# Patient Record
Sex: Male | Born: 1960 | Race: Black or African American | Hispanic: No | Marital: Single | Smoking: Current every day smoker
Health system: Southern US, Community
[De-identification: ages and names within clinical notes are randomized; demographics above are authoritative.]

## PROBLEM LIST (undated history)

## (undated) DIAGNOSIS — I10 Essential (primary) hypertension: Secondary | ICD-10-CM

## (undated) DIAGNOSIS — E119 Type 2 diabetes mellitus without complications: Secondary | ICD-10-CM

## (undated) DIAGNOSIS — M109 Gout, unspecified: Secondary | ICD-10-CM

---

## 2017-12-20 ENCOUNTER — Emergency Department
Admission: EM | Admit: 2017-12-20 | Discharge: 2017-12-20 | Disposition: A | Discharge status: Home / Self Care | Payer: BLUE CROSS/BLUE SHIELD | Attending: Emergency Medicine | Admitting: Emergency Medicine

## 2017-12-20 ENCOUNTER — Other Ambulatory Visit: Payer: Self-pay

## 2017-12-20 ENCOUNTER — Encounter: Payer: Self-pay | Admitting: Emergency Medicine

## 2017-12-20 DIAGNOSIS — M25561 Pain in right knee: Secondary | ICD-10-CM

## 2017-12-20 DIAGNOSIS — I10 Essential (primary) hypertension: Secondary | ICD-10-CM

## 2017-12-20 DIAGNOSIS — M25551 Pain in right hip: Secondary | ICD-10-CM

## 2017-12-20 DIAGNOSIS — F17210 Nicotine dependence, cigarettes, uncomplicated: Secondary | ICD-10-CM | POA: Diagnosis not present

## 2017-12-20 DIAGNOSIS — M545 Low back pain: Secondary | ICD-10-CM | POA: Diagnosis present

## 2017-12-20 DIAGNOSIS — M79672 Pain in left foot: Secondary | ICD-10-CM | POA: Diagnosis not present

## 2017-12-20 HISTORY — DX: Gout, unspecified: M10.9

## 2017-12-20 MED ORDER — LISINOPRIL 5 MG PO TABS: 5.0000 mg | ORAL_TABLET | Freq: Every day | ORAL | 11 refills | Status: AC

## 2017-12-20 MED ORDER — MELOXICAM 15 MG PO TABS: 15.0000 mg | ORAL_TABLET | Freq: Every day | ORAL | 0 refills | Status: DC

## 2017-12-20 MED ORDER — TRAMADOL HCL 50 MG PO TABS: 50.0000 mg | ORAL_TABLET | Freq: Four times a day (QID) | ORAL | 0 refills | Status: AC | PRN

## 2017-12-20 NOTE — ED Provider Notes (Signed)
[facility] Emergency Department Provider Note ____________________________________________  Time seen: Approximately 11:10 AM  I have reviewed the triage vital signs and the nursing notes.   HISTORY  Chief Complaint Knee Pain; Foot Pain; and Back Pain    HPI Zamauri Nez is a 57 y.o. male who presents to the emergency department for evaluation and treatment of right lower back pain and right knee pain.  Unsure if he twisted his knee. He also complains of pain to the left foot with a "hard knot" under the skin. Additionally, he has been out of his blood pressure medication and would like enough to last until he can schedule an appointment with primary care.  Past Medical History:  Diagnosis Date  . Gout     There are no active problems to display for this patient.   History reviewed. No pertinent surgical history.  Prior to Admission medications   Medication Sig Start Date End Date Taking? Authorizing Provider  lisinopril (PRINIVIL,ZESTRIL) 5 MG tablet Take 1 tablet (5 mg total) by mouth daily. 12/20/17 12/20/18  Broughton Eppinger, Rulon Eisenmenger B, FNP  meloxicam (MOBIC) 15 MG tablet Take 1 tablet (15 mg total) by mouth daily. 12/20/17   Lashan Macias, Rulon Eisenmenger B, FNP  traMADol (ULTRAM) 50 MG tablet Take 1 tablet (50 mg total) by mouth every 6 (six) hours as needed. 12/20/17   Chinita Pester, FNP    Allergies Patient has no allergy information on record.  No family history on file.  Social History Social History   Tobacco Use  . Smoking status: Current Every Day Smoker    Packs/day: 0.50    Types: Cigarettes  . Smokeless tobacco: Never Used  Substance Use Topics  . Alcohol use: Yes  . Drug use: Not Currently    Review of Systems Constitutional: Negative for fever. Cardiovascular: Negative for chest pain. Respiratory: Negative for shortness of breath. Musculoskeletal: Positive for right knee and hip pain and left foot pain. Skin: Negative for open wound or  lesion.  Neurological: Negative for decrease in sensation  ____________________________________________   PHYSICAL EXAM:  VITAL SIGNS: ED Triage Vitals  Enc Vitals Group     BP 12/20/17 0953 (!) 184/79     Pulse Rate 12/20/17 0953 83     Resp 12/20/17 0953 16     Temp 12/20/17 0953 98.2 F (36.8 C)     Temp Source 12/20/17 0953 Oral     SpO2 12/20/17 0953 97 %     Weight 12/20/17 0955 221 lb (100.2 kg)     Height 12/20/17 0955 6' 1.5" (1.867 m)     Head Circumference --      Peak Flow --      Pain Score 12/20/17 0955 7     Pain Loc --      Pain Edu? --      Excl. in GC? --     Constitutional: Alert and oriented. Well appearing and in no acute distress. Eyes: Conjunctivae are clear without discharge or drainage Head: Atraumatic Neck: Supple. Respiratory: No cough. Respirations are even and unlabored. Musculoskeletal: Focal tenderness in the popliteal area of the right knee. No focal tenderness of the right hip. Hard calcification in the soft tissue on the arch of the left foot. Full ROM of the toes. Arch is intact. Neurologic: Awake, alert, and oriented x 4.  Skin: Intact.  Psychiatric: Affect and behavior are appropriate.  ____________________________________________   LABS (all labs ordered are listed, but only abnormal results are displayed)  Labs Reviewed - No data to display ____________________________________________  RADIOLOGY  Not indicated. ____________________________________________   PROCEDURES  Procedures  ____________________________________________   INITIAL IMPRESSION / ASSESSMENT AND PLAN / ED COURSE  Dillion Stowers is a 56 y.o. who presents to the emergency department for treatment and evaluation of RLE, left foot pain, and medication refill. Knee has hurt for "a long time" and swells occasionally. Swelling is resolved with rest and elevation. Hip pain is likely secondary to altered gait due to knee pain. Left foot nodule is mobile. He  is to follow up with orthopedics and podiatry. He is also to schedule an appointment with [facility] for primary care to manage his hypertension. Today, he will be given Tramadol, Meloxicam, and Lisinopril. He is to return to the ER for symptoms that change or worsen or for new concerns.  Medications - No data to display  Pertinent labs & imaging results that were available during my care of the patient were reviewed by me and considered in my medical decision making (see chart for details).  _________________________________________   FINAL CLINICAL IMPRESSION(S) / ED DIAGNOSES  Final diagnoses:  Acute pain of right knee  Acute pain of right hip  Foot pain, left  Hypertension, unspecified type    ED Discharge Orders        Ordered    meloxicam (MOBIC) 15 MG tablet  Daily     12/20/17 1139    traMADol (ULTRAM) 50 MG tablet  Every 6 hours PRN     12/20/17 1139    lisinopril (PRINIVIL,ZESTRIL) 5 MG tablet  Daily     12/20/17 1139       If controlled substance prescribed during this visit, 12 month history viewed on the [place] prior to issuing an initial prescription for Schedule II or III opiod.    Chinita Pester, FNP 12/20/17 1146    Jene Every, MD 12/20/17 1221

## 2017-12-20 NOTE — ED Notes (Signed)
See triage note presents with pain to right lower back and knee   Unsure of injury but may have twisted  His knee   Min swelling noted  Also having some pain to left foot  Ambulates well to treatment room

## 2017-12-20 NOTE — ED Notes (Signed)
Patient states he has HA's frequently.  Is supposed to take Lisinopril but has not taken in 2 years.

## 2017-12-20 NOTE — ED Notes (Signed)
First Nurse Note:  Patient ambulatory to registration, states "I'm falling apart".  Complaining of pain in left foot and right knee.

## 2017-12-20 NOTE — ED Triage Notes (Signed)
Patient complaining of pain right knee, denies injury.  Pain for "a few weeks, worse in early AM".  States there is "a ball" on the bottom of his left foot for a "little while, getting better".  Back pain for "a few months", indicates lower back radiating to right hip.

## 2017-12-24 DIAGNOSIS — M1711 Unilateral primary osteoarthritis, right knee: Secondary | ICD-10-CM | POA: Insufficient documentation

## 2018-03-20 ENCOUNTER — Other Ambulatory Visit: Payer: Self-pay

## 2018-03-20 ENCOUNTER — Emergency Department
Admission: EM | Admit: 2018-03-20 | Discharge: 2018-03-20 | Disposition: A | Discharge status: Home / Self Care | Payer: BLUE CROSS/BLUE SHIELD | Attending: Emergency Medicine | Admitting: Emergency Medicine

## 2018-03-20 ENCOUNTER — Encounter: Payer: Self-pay | Admitting: Emergency Medicine

## 2018-03-20 ENCOUNTER — Emergency Department: Payer: BLUE CROSS/BLUE SHIELD

## 2018-03-20 DIAGNOSIS — Y929 Unspecified place or not applicable: Secondary | ICD-10-CM | POA: Diagnosis not present

## 2018-03-20 DIAGNOSIS — I10 Essential (primary) hypertension: Secondary | ICD-10-CM | POA: Diagnosis not present

## 2018-03-20 DIAGNOSIS — M1990 Unspecified osteoarthritis, unspecified site: Secondary | ICD-10-CM | POA: Diagnosis not present

## 2018-03-20 DIAGNOSIS — Z79899 Other long term (current) drug therapy: Secondary | ICD-10-CM | POA: Insufficient documentation

## 2018-03-20 DIAGNOSIS — X509XXA Other and unspecified overexertion or strenuous movements or postures, initial encounter: Secondary | ICD-10-CM | POA: Diagnosis not present

## 2018-03-20 DIAGNOSIS — M549 Dorsalgia, unspecified: Secondary | ICD-10-CM

## 2018-03-20 DIAGNOSIS — Y999 Unspecified external cause status: Secondary | ICD-10-CM | POA: Diagnosis not present

## 2018-03-20 DIAGNOSIS — Y939 Activity, unspecified: Secondary | ICD-10-CM | POA: Diagnosis not present

## 2018-03-20 DIAGNOSIS — S80911A Unspecified superficial injury of right knee, initial encounter: Secondary | ICD-10-CM | POA: Diagnosis present

## 2018-03-20 DIAGNOSIS — M545 Low back pain: Secondary | ICD-10-CM | POA: Diagnosis not present

## 2018-03-20 DIAGNOSIS — M159 Polyosteoarthritis, unspecified: Secondary | ICD-10-CM

## 2018-03-20 DIAGNOSIS — F17210 Nicotine dependence, cigarettes, uncomplicated: Secondary | ICD-10-CM | POA: Insufficient documentation

## 2018-03-20 HISTORY — DX: Essential (primary) hypertension: I10

## 2018-03-20 MED ORDER — MELOXICAM 15 MG PO TABS: 15.0000 mg | ORAL_TABLET | Freq: Every day | ORAL | 0 refills | Status: AC

## 2018-03-20 NOTE — ED Triage Notes (Signed)
Pt via pov from home with right knee pain and lower back pain x 2 weeks or more. Also c/o generalized body "tingling" and pain for "a while." Pt denies injury. NAD noted.

## 2018-03-20 NOTE — ED Provider Notes (Signed)
[facility] Emergency Department Provider Note  ____________________________________________  Time seen: Approximately 9:55 AM  I have reviewed the triage vital signs and the nursing notes.   HISTORY  Chief Complaint Knee Pain    HPI Dominic Ponce is a 57 y.o. male that presents emergency department for evaluation of low back pain, bilateral hip pain, left shoulder pin, right knee pain for "a long time." Patient states that knee is the worst.  He can feel his knee "popping."  His shoulder and upper back hurts worse when he lifts his arm.  He has to walk bent over due to low back pain.  Pain is worst when he is at work.  No trauma.  No bowel or bladder dysfunction or saddle anesthesias.  Patient was recently seen for similar and was given referrals to ortho and podiatry but has not followed up. No fever, chills, nausea, vomiting, abdominal pain.  Past Medical History:  Diagnosis Date  . Gout   . Hypertension     There are no active problems to display for this patient.   History reviewed. No pertinent surgical history.  Prior to Admission medications   Medication Sig Start Date End Date Taking? Authorizing Provider  lisinopril (PRINIVIL,ZESTRIL) 5 MG tablet Take 1 tablet (5 mg total) by mouth daily. 12/20/17 12/20/18 Yes Triplett, Cari B, FNP  meloxicam (MOBIC) 15 MG tablet Take 1 tablet (15 mg total) by mouth daily for 10 days. 03/20/18 03/30/18  Enid Derry, PA-C  traMADol (ULTRAM) 50 MG tablet Take 1 tablet (50 mg total) by mouth every 6 (six) hours as needed. 12/20/17   Chinita Pester, FNP    Allergies Patient has no known allergies.  History reviewed. No pertinent family history.  Social History Social History   Tobacco Use  . Smoking status: Current Every Day Smoker    Packs/day: 0.50    Types: Cigarettes  . Smokeless tobacco: Never Used  Substance Use Topics  . Alcohol use: Yes    Comment: occasional  . Drug use: Not Currently      Review of Systems  Constitutional: No fever/chills ENT: No upper respiratory complaints. Cardiovascular: No chest pain. Respiratory: No SOB. Gastrointestinal: No abdominal pain.  No nausea, no vomiting.  Musculoskeletal: Positive for back, shoulder, hip, knee pain. Skin: Negative for rash, abrasions, lacerations, ecchymosis.   ____________________________________________   PHYSICAL EXAM:  VITAL SIGNS: ED Triage Vitals  Enc Vitals Group     BP 03/20/18 0938 (!) 161/88     Pulse Rate 03/20/18 0938 76     Resp 03/20/18 0938 16     Temp 03/20/18 0938 98 F (36.7 C)     Temp Source 03/20/18 0938 Oral     SpO2 03/20/18 0937 98 %     Weight 03/20/18 0938 212 lb (96.2 kg)     Height 03/20/18 0938 6\' 1"  (1.854 m)     Head Circumference --      Peak Flow --      Pain Score 03/20/18 0938 7     Pain Loc --      Pain Edu? --      Excl. in GC? --      Constitutional: Alert and oriented. Well appearing and in no acute distress. Eyes: Conjunctivae are normal. PERRL. EOMI. Head: Atraumatic. ENT:      Ears:      Nose: No congestion/rhinnorhea.      Mouth/Throat: Mucous membranes are moist.  Neck: No stridor.   Cardiovascular: Normal  rate, regular rhythm.  Good peripheral circulation. Respiratory: Normal respiratory effort without tachypnea or retractions. Lungs CTAB. Good air entry to the bases with no decreased or absent breath sounds. Gastrointestinal: Bowel sounds 4 quadrants. Soft and nontender to palpation. No guarding or rigidity. No palpable masses. No distention. Musculoskeletal: Full range of motion to all extremities. No gross deformities appreciated.  Full range of motion of knee and nontender to palpation.  No erythema or swelling.  Mild lumbar spinal tenderness.  Full range of motion of bilateral hips. Full ROM of shoulder. Pain to upper left back with abduction of left shoulder. Weightbearing.  Antalgic gait. Strength equal in upper and lower extremities  bilaterally. Neurologic:  Normal speech and language. No gross focal neurologic deficits are appreciated.  Skin:  Skin is warm, dry and intact. No rash noted. Psychiatric: Mood and affect are normal. Speech and behavior are normal. Patient exhibits appropriate insight and judgement.   ____________________________________________   LABS (all labs ordered are listed, but only abnormal results are displayed)  Labs Reviewed - No data to display ____________________________________________  EKG   ____________________________________________  RADIOLOGY Lexine BatonI, Adah Stoneberg, personally viewed and evaluated these images (plain radiographs) as part of my medical decision making, as well as reviewing the written report by the radiologist.  Dg Lumbar Spine 2-3 Views  Result Date: 03/20/2018 CLINICAL DATA:  Low back and knee pain. EXAM: LUMBAR SPINE - 2-3 VIEW COMPARISON:  None. FINDINGS: Advanced degenerative lumbar spondylosis with multilevel disc disease and facet disease. No acute bony findings or destructive bony changes. The overall alignment is maintained. Significant age advanced atherosclerotic calcifications involving the aorta and iliac arteries but no definite aneurysm. The visualized bony pelvis appears intact. The SI joints appear normal. IMPRESSION: Age advanced degenerative lumbar spondylosis with multilevel disc disease and facet disease but no acute bony findings. Age advanced atherosclerotic calcifications involving the aorta iliac arteries without definite aneurysm. Electronically Signed   By: Rudie MeyerP.  Gallerani M.D.   On: 03/20/2018 10:44   Dg Knee 2 Views Right  Result Date: 03/20/2018 CLINICAL DATA:  Right knee pain and swelling, no injury. EXAM: RIGHT KNEE - 1-2 VIEW COMPARISON:  None. FINDINGS: No acute osseous or joint abnormality. Osteophytosis in the lateral compartment. IMPRESSION: No acute osseous or joint abnormality. Lateral compartment osteophytosis. Electronically Signed    By: Leanna BattlesMelinda  Blietz M.D.   On: 03/20/2018 10:34   [place] Aorta/ivc/iliacs Dop Ltd  Result Date: 03/20/2018 CLINICAL DATA:  Back, hip and leg pain.  Hypertension.  Tobacco use. EXAM: ULTRASOUND OF ABDOMINAL AORTA TECHNIQUE: Ultrasound examination of the abdominal aorta was performed to evaluate for abdominal aortic aneurysm. COMPARISON:  None. FINDINGS: Abdominal aortic measurements as follows: Proximal:  2.8 cm Mid:  2.0 cm Distal:  1.8 cm Iliacs: Right common iliac artery measures up to 1.3 cm. Left common iliac artery measures up to 1.4 cm. Other: IVC is patent. IMPRESSION: Negative for an abdominal aortic aneurysm. Electronically Signed   By: Richarda OverlieAdam  Henn M.D.   On: 03/20/2018 12:51    ____________________________________________    PROCEDURES  Procedure(s) performed:    Procedures    Medications - No data to display   ____________________________________________   INITIAL IMPRESSION / ASSESSMENT AND PLAN / ED COURSE  Pertinent labs & imaging results that were available during my care of the patient were reviewed by me and considered in my medical decision making (see chart for details).  Review of the [place] CSRS was performed in accordance of the [facility] prior to  dispensing any controlled drugs.     Patient's diagnosis is consistent with osteoarthritis.  Vital signs and exam are reassuring.  Lumbar x-ray and knee x-ray are consistent with arthritis.  Lumbar x-ray shows calcifications in the aortic and illiac arteries.  No signs of aneurysm on ultrasound. Findings were discussed with patient and he was encouraged to follow up with primary care. Pain is reproducible with range of motion.  Patient will be discharged home with prescriptions for meloxicam. Patient is to follow up with ortho and PCP as directed. Patient is agreeable to call both for appointment. Patient is given ED precautions to return to the ED for any worsening or new  symptoms.     ____________________________________________  FINAL CLINICAL IMPRESSION(S) / ED DIAGNOSES  Final diagnoses:  Back pain  Osteoarthritis of multiple joints, unspecified osteoarthritis type      NEW MEDICATIONS STARTED DURING THIS VISIT:  ED Discharge Orders         Ordered    meloxicam (MOBIC) 15 MG tablet  Daily     03/20/18 1321              This chart was dictated using voice recognition software/Dragon. Despite best efforts to proofread, errors can occur which can change the meaning. Any change was purely unintentional.    Enid DerryWagner, Tyjuan Demetro, PA-C 03/20/18 1526    Arnaldo NatalMalinda, Paul F, MD 03/21/18 2112

## 2018-06-17 ENCOUNTER — Other Ambulatory Visit (HOSPITAL_COMMUNITY): Payer: Self-pay | Admitting: Family Medicine

## 2018-06-17 DIAGNOSIS — M5117 Intervertebral disc disorders with radiculopathy, lumbosacral region: Secondary | ICD-10-CM

## 2018-06-19 ENCOUNTER — Telehealth: Payer: Self-pay | Admitting: *Deleted

## 2018-06-19 NOTE — Telephone Encounter (Signed)
Received referral for low dose lung cancer screening CT scan. Message left at phone number listed in EMR for patient to call me back to facilitate scheduling scan.  

## 2018-06-26 ENCOUNTER — Telehealth: Payer: Self-pay | Admitting: *Deleted

## 2018-06-26 NOTE — Telephone Encounter (Signed)
Received referral for low dose lung cancer screening CT scan. Message left at phone number listed in EMR for patient to call me back to facilitate scheduling scan.  

## 2018-07-02 ENCOUNTER — Telehealth: Payer: Self-pay | Admitting: *Deleted

## 2018-07-02 NOTE — Telephone Encounter (Signed)
Received referral for low dose lung cancer screening CT scan. Message left at phone number listed in EMR for patient to call me back to facilitate scheduling scan.  

## 2018-07-03 ENCOUNTER — Encounter: Payer: Self-pay | Admitting: *Deleted

## 2018-07-05 ENCOUNTER — Ambulatory Visit: Payer: BLUE CROSS/BLUE SHIELD

## 2018-07-12 DIAGNOSIS — I10 Essential (primary) hypertension: Secondary | ICD-10-CM | POA: Insufficient documentation

## 2018-07-15 ENCOUNTER — Ambulatory Visit: Payer: Self-pay | Admitting: Podiatry

## 2018-07-23 ENCOUNTER — Ambulatory Visit: Payer: BLUE CROSS/BLUE SHIELD

## 2019-11-17 ENCOUNTER — Ambulatory Visit: Payer: Medicaid Other | Attending: Internal Medicine

## 2020-08-31 ENCOUNTER — Other Ambulatory Visit: Payer: Self-pay | Admitting: Family Medicine

## 2020-08-31 DIAGNOSIS — R1011 Right upper quadrant pain: Secondary | ICD-10-CM

## 2020-09-03 ENCOUNTER — Ambulatory Visit: Admission: RE | Admit: 2020-09-03 | Payer: Medicaid Other | Source: Ambulatory Visit

## 2020-09-16 ENCOUNTER — Other Ambulatory Visit: Payer: Self-pay | Admitting: Family Medicine

## 2020-09-16 ENCOUNTER — Other Ambulatory Visit (HOSPITAL_COMMUNITY): Payer: Self-pay | Admitting: Family Medicine

## 2020-09-16 DIAGNOSIS — R229 Localized swelling, mass and lump, unspecified: Secondary | ICD-10-CM

## 2020-09-21 ENCOUNTER — Ambulatory Visit
Admission: RE | Admit: 2020-09-21 | Discharge: 2020-09-21 | Disposition: A | Discharge status: Home / Self Care | Payer: Medicaid Other | Source: Ambulatory Visit | Attending: Family Medicine | Admitting: Family Medicine

## 2020-09-21 ENCOUNTER — Ambulatory Visit: Payer: Medicaid Other

## 2020-09-21 DIAGNOSIS — R1011 Right upper quadrant pain: Secondary | ICD-10-CM

## 2020-09-21 DIAGNOSIS — R229 Localized swelling, mass and lump, unspecified: Secondary | ICD-10-CM

## 2020-10-25 ENCOUNTER — Other Ambulatory Visit: Payer: Self-pay | Admitting: Family Medicine

## 2020-10-25 ENCOUNTER — Other Ambulatory Visit: Payer: Self-pay

## 2020-10-25 ENCOUNTER — Ambulatory Visit
Admission: RE | Admit: 2020-10-25 | Discharge: 2020-10-25 | Disposition: A | Discharge status: Home / Self Care | Payer: Medicaid Other | Attending: Family Medicine | Admitting: Family Medicine

## 2020-10-25 ENCOUNTER — Ambulatory Visit
Admission: RE | Admit: 2020-10-25 | Discharge: 2020-10-25 | Disposition: A | Discharge status: Home / Self Care | Payer: Medicaid Other | Source: Ambulatory Visit | Attending: Family Medicine | Admitting: Family Medicine

## 2020-10-25 DIAGNOSIS — R229 Localized swelling, mass and lump, unspecified: Secondary | ICD-10-CM | POA: Insufficient documentation

## 2020-12-15 ENCOUNTER — Other Ambulatory Visit: Payer: Self-pay

## 2020-12-15 ENCOUNTER — Emergency Department
Admission: EM | Admit: 2020-12-15 | Discharge: 2020-12-15 | Disposition: A | Discharge status: Home / Self Care | Payer: Medicaid Other | Attending: Emergency Medicine | Admitting: Emergency Medicine

## 2020-12-15 DIAGNOSIS — Z79899 Other long term (current) drug therapy: Secondary | ICD-10-CM | POA: Diagnosis not present

## 2020-12-15 DIAGNOSIS — L509 Urticaria, unspecified: Secondary | ICD-10-CM | POA: Insufficient documentation

## 2020-12-15 DIAGNOSIS — F17210 Nicotine dependence, cigarettes, uncomplicated: Secondary | ICD-10-CM | POA: Diagnosis not present

## 2020-12-15 DIAGNOSIS — I10 Essential (primary) hypertension: Secondary | ICD-10-CM | POA: Insufficient documentation

## 2020-12-15 DIAGNOSIS — R21 Rash and other nonspecific skin eruption: Secondary | ICD-10-CM | POA: Diagnosis present

## 2020-12-15 MED ORDER — DIPHENHYDRAMINE HCL 25 MG PO CAPS
50.0000 mg | ORAL_CAPSULE | Freq: Once | ORAL | Status: AC
  Administered 2020-12-15: 50 mg via ORAL
  Filled 2020-12-15: qty 2

## 2020-12-15 NOTE — ED Provider Notes (Signed)
[facility] Emergency Department Provider Note   ____________________________________________   Event Date/Time   First MD Initiated Contact with Patient 12/15/20 1802     (approximate)  I have reviewed the triage vital signs and the nursing notes.   HISTORY  Chief Complaint Rash    HPI Dominic Ponce is a 60 y.o. male with past medical history of hypertension and gout who presents to the ED for rash.  Patient reports that shortly after waking up this morning he started to notice red, raised, and itchy rash affecting his left leg.  It has since spread to both arms and to his right leg, he denies any associated pain, drainage, or sloughing of skin.  He has not had any fevers and has otherwise been feeling well.  He does state that his daughter recently changed his detergent, he denies any new foods or medications.  He has not taken anything for his symptoms prior to arrival.  He denies any lightheadedness, nausea, vomiting, diarrhea, or difficulty breathing.        Past Medical History:  Diagnosis Date  . Gout   . Hypertension     Patient Active Problem List   Diagnosis Date Noted  . Hypertension 07/12/2018  . Primary osteoarthritis of right knee 12/24/2017    History reviewed. No pertinent surgical history.  Prior to Admission medications   Medication Sig Start Date End Date Taking? Authorizing Provider  albuterol (PROAIR HFA) 108 (90 Base) MCG/ACT inhaler  06/03/18   [provider]  atorvastatin (LIPITOR) 40 MG tablet TAKE 1 BY MOUTH AT BEDTIME FOR HIGH CHOLESTEROL 05/05/18   [provider]  celecoxib (CELEBREX) 100 MG capsule TAKE 1 CAPSULE BY MOUTH TWICE A DAY 06/07/18   [provider]  gabapentin (NEURONTIN) 100 MG capsule TAKE 1 CAPSULE BY MOUTH 3 TIMES A DAY FOR NERVE P AIN 06/03/18   [provider]  lisinopril (PRINIVIL,ZESTRIL) 5 MG tablet Take 1 tablet (5 mg total) by mouth daily. 12/20/17 12/20/18   Triplett, Rulon Eisenmenger B, FNP  lisinopril (PRINIVIL,ZESTRIL) 5 MG tablet TK 1 T PO  QD 12/20/17   [provider]  omeprazole (PRILOSEC) 20 MG capsule TAKE 1 CAPSULE BY MOUTH EVERY DAY FOR REFLUX 05/02/18   [provider]  traMADol (ULTRAM) 50 MG tablet Take 1 tablet (50 mg total) by mouth every 6 (six) hours as needed. 12/20/17   Chinita Pester, FNP    Allergies Patient has no known allergies.  History reviewed. No pertinent family history.  Social History Social History   Tobacco Use  . Smoking status: Current Every Day Smoker    Packs/day: 0.50    Types: Cigarettes  . Smokeless tobacco: Never Used  Vaping Use  . Vaping Use: Never used  Substance Use Topics  . Alcohol use: Yes    Comment: occasional  . Drug use: Not Currently    Review of Systems  Constitutional: No fever/chills Eyes: No visual changes. ENT: No sore throat. Cardiovascular: Denies chest pain. Respiratory: Denies shortness of breath. Gastrointestinal: No abdominal pain.  No nausea, no vomiting.  No diarrhea.  No constipation. Genitourinary: Negative for dysuria. Musculoskeletal: Negative for back pain. Skin: Positive for rash. Neurological: Negative for headaches, focal weakness or numbness.  ____________________________________________   PHYSICAL EXAM:  VITAL SIGNS: ED Triage Vitals  Enc Vitals Group     BP 12/15/20 1803 110/68     Pulse Rate 12/15/20 1803 91     Resp 12/15/20 1803 18  Temp 12/15/20 1803 98 F (36.7 C)     Temp Source 12/15/20 1803 Oral     SpO2 12/15/20 1803 100 %     Weight 12/15/20 1802 220 lb (99.8 kg)     Height 12/15/20 1802 6\' 1"  (1.854 m)     Head Circumference --      Peak Flow --      Pain Score 12/15/20 1802 0     Pain Loc --      Pain Edu? --      Excl. in GC? --     Constitutional: Alert and oriented. Eyes: Conjunctivae are normal. Head: Atraumatic. Nose: No congestion/rhinnorhea. Mouth/Throat: Mucous membranes are moist. Neck: Normal  ROM Cardiovascular: Normal rate, regular rhythm. Grossly normal heart sounds. Respiratory: Normal respiratory effort.  No retractions. Lungs CTAB. Gastrointestinal: Soft and nontender. No distention. Genitourinary: deferred Musculoskeletal: No lower extremity tenderness nor edema. Neurologic:  Normal speech and language. No gross focal neurologic deficits are appreciated. Skin:  Skin is warm, dry and intact.  Hives noted to left lower extremity and bilateral upper extremities. Psychiatric: Mood and affect are normal. Speech and behavior are normal.  ____________________________________________   LABS (all labs ordered are listed, but only abnormal results are displayed)  Labs Reviewed - No data to display   PROCEDURES  Procedure(s) performed (including Critical Care):  Procedures   ____________________________________________   INITIAL IMPRESSION / ASSESSMENT AND PLAN / ED COURSE       60 year old male with past medical history of hypertension and gout who presents to the ED with red, raised, and itchy rash starting earlier today consistent with hives.  He has no symptoms of anaphylaxis and we will treat with dose of Benadryl.  He is appropriate for discharge home and was counseled to switch back to his usual detergent, clean his close and sheets.  He was counseled to follow-up with his PCP and to return to the ED for new worsening symptoms, patient agrees with plan.      ____________________________________________   FINAL CLINICAL IMPRESSION(S) / ED DIAGNOSES  Final diagnoses:  Hives     ED Discharge Orders    None       Note:  This document was prepared using Dragon voice recognition software and may include unintentional dictation errors.   77, MD 12/15/20 [phone]

## 2020-12-15 NOTE — ED Triage Notes (Signed)
Rash to arms and legs.  Noticed today.

## 2021-02-08 DIAGNOSIS — M47812 Spondylosis without myelopathy or radiculopathy, cervical region: Secondary | ICD-10-CM | POA: Insufficient documentation

## 2021-02-09 DIAGNOSIS — M545 Low back pain, unspecified: Secondary | ICD-10-CM | POA: Insufficient documentation

## 2021-03-08 ENCOUNTER — Encounter: Payer: Self-pay | Admitting: *Deleted

## 2021-03-09 ENCOUNTER — Telehealth (INDEPENDENT_AMBULATORY_CARE_PROVIDER_SITE_OTHER): Payer: Self-pay | Admitting: Gastroenterology

## 2021-03-09 DIAGNOSIS — Z86010 Personal history of colonic polyps: Secondary | ICD-10-CM

## 2021-03-09 MED ORDER — PEG 3350-KCL-NA BICARB-NACL 420 G PO SOLR: 4000.0000 mL | Freq: Once | ORAL | 0 refills | Status: AC

## 2021-03-09 NOTE — Progress Notes (Signed)
Gastroenterology Pre-Procedure Review  Request Date: 04/01/21 Requesting Physician: Dr. Tobi Bastos  PATIENT REVIEW QUESTIONS: The patient responded to the following health history questions as indicated:    1. Are you having any GI issues? no 2. Do you have a personal history of Polyps? yes (2012 polyps were removed per patient.) 3. Do you have a family history of Colon Cancer or Polyps? no 4. Diabetes Mellitus? yes (Type II) 5. Joint replacements in the past 12 months?no 6. Major health problems in the past 3 months?no 7. Any artificial heart valves, MVP, or defibrillator?no    MEDICATIONS & ALLERGIES:    Patient reports the following regarding taking any anticoagulation/antiplatelet therapy:   Plavix, Coumadin, Eliquis, Xarelto, Lovenox, Pradaxa, Brilinta, or Effient? no Aspirin? no  Patient confirms/reports the following medications:  Current Outpatient Medications  Medication Sig Dispense Refill   albuterol (PROAIR HFA) 108 (90 Base) MCG/ACT inhaler      atorvastatin (LIPITOR) 40 MG tablet TAKE 1 BY MOUTH AT BEDTIME FOR HIGH CHOLESTEROL     celecoxib (CELEBREX) 100 MG capsule TAKE 1 CAPSULE BY MOUTH TWICE A DAY     gabapentin (NEURONTIN) 100 MG capsule TAKE 1 CAPSULE BY MOUTH 3 TIMES A DAY FOR NERVE P AIN     lisinopril (PRINIVIL,ZESTRIL) 5 MG tablet Take 1 tablet (5 mg total) by mouth daily. 30 tablet 11   lisinopril (PRINIVIL,ZESTRIL) 5 MG tablet TK 1 T PO  QD     omeprazole (PRILOSEC) 20 MG capsule TAKE 1 CAPSULE BY MOUTH EVERY DAY FOR REFLUX     traMADol (ULTRAM) 50 MG tablet Take 1 tablet (50 mg total) by mouth every 6 (six) hours as needed. 12 tablet 0   No current facility-administered medications for this visit.    Patient confirms/reports the following allergies:  No Known Allergies  No orders of the defined types were placed in this encounter.   AUTHORIZATION INFORMATION Primary Insurance: 1D#: Group #:  Secondary Insurance: 1D#: Group #:  [id number]  INFORMATION: Date: 04/01/21 Time: Location: [facility]

## 2021-03-31 ENCOUNTER — Telehealth: Payer: Self-pay | Admitting: Gastroenterology

## 2021-03-31 ENCOUNTER — Other Ambulatory Visit: Payer: Self-pay

## 2021-03-31 NOTE — Progress Notes (Signed)
Patient requested to reschedule procedure due to him not feeling well. Endo unit has been notified of change. Updaed instructions will be mailed.

## 2021-03-31 NOTE — Telephone Encounter (Signed)
Patient has been rescheduled for 09/27.

## 2021-03-31 NOTE — Telephone Encounter (Signed)
Patient wants to reschedule procedure. 

## 2021-05-02 NOTE — Anesthesia Preprocedure Evaluation (Addendum)
Anesthesia Evaluation  Patient identified by MRN, date of birth, ID band Patient awake    Reviewed: Allergy & Precautions, NPO status , Patient's Chart, lab work & pertinent test results  Airway Mallampati: II  TM Distance: >3 FB Neck ROM: full    Dental  (+) Upper Dentures, Lower Dentures   Pulmonary neg pulmonary ROS, COPD,  COPD inhaler, Current Smoker,    Pulmonary exam normal  + decreased breath sounds      Cardiovascular Exercise Tolerance: Good hypertension, Pt. on medications negative cardio ROS Normal cardiovascular exam Rhythm:Regular Rate:Normal     Neuro/Psych negative neurological ROS  negative psych ROS   GI/Hepatic negative GI ROS, Neg liver ROS, GERD  ,  Endo/Other  negative endocrine ROSdiabetes, Well Controlled, Type 2, Oral Hypoglycemic Agents  Renal/GU negative Renal ROS  negative genitourinary   Musculoskeletal  (+) Arthritis , Gout   Abdominal Normal abdominal exam  (+)   Peds  Hematology negative hematology ROS (+)   Anesthesia Other Findings Past Medical History: No date: Diabetes mellitus without complication (HCC) No date: Gout No date: Hypertension  History reviewed. No pertinent surgical history.  BMI    Body Mass Index: 29.55 kg/m      Reproductive/Obstetrics negative OB ROS                            Anesthesia Physical Anesthesia Plan  ASA: 3  Anesthesia Plan: General   Post-op Pain Management:    Induction: Intravenous  PONV Risk Score and Plan: Treatment may vary due to age or medical condition  Airway Management Planned: Natural Airway and Nasal Cannula  Additional Equipment:   Intra-op Plan:   Post-operative Plan:   Informed Consent: I have reviewed the patients History and Physical, chart, labs and discussed the procedure including the risks, benefits and alternatives for the proposed anesthesia with the patient or authorized  representative who has indicated his/her understanding and acceptance.     Dental Advisory Given  Plan Discussed with: CRNA and Surgeon  Anesthesia Plan Comments:        Anesthesia Quick Evaluation

## 2021-05-31 ENCOUNTER — Encounter: Payer: Self-pay | Admitting: Gastroenterology

## 2021-06-02 ENCOUNTER — Ambulatory Visit
Admission: RE | Admit: 2021-06-02 | Discharge: 2021-06-02 | Disposition: A | Discharge status: Home / Self Care | Payer: Medicare Other | Attending: Gastroenterology | Admitting: Gastroenterology

## 2021-06-02 ENCOUNTER — Other Ambulatory Visit: Payer: Self-pay

## 2021-06-02 ENCOUNTER — Ambulatory Visit: Payer: Medicare Other | Admitting: Anesthesiology

## 2021-06-02 ENCOUNTER — Telehealth: Payer: Self-pay

## 2021-06-02 ENCOUNTER — Encounter
Admission: RE | Disposition: A | Discharge status: Home / Self Care | Payer: Self-pay | Source: Home / Self Care | Attending: Gastroenterology

## 2021-06-02 ENCOUNTER — Encounter: Payer: Self-pay | Admitting: Gastroenterology

## 2021-06-02 DIAGNOSIS — Z791 Long term (current) use of non-steroidal anti-inflammatories (NSAID): Secondary | ICD-10-CM | POA: Insufficient documentation

## 2021-06-02 DIAGNOSIS — E119 Type 2 diabetes mellitus without complications: Secondary | ICD-10-CM | POA: Insufficient documentation

## 2021-06-02 DIAGNOSIS — Z8719 Personal history of other diseases of the digestive system: Secondary | ICD-10-CM | POA: Insufficient documentation

## 2021-06-02 DIAGNOSIS — Z79899 Other long term (current) drug therapy: Secondary | ICD-10-CM | POA: Insufficient documentation

## 2021-06-02 DIAGNOSIS — Z7984 Long term (current) use of oral hypoglycemic drugs: Secondary | ICD-10-CM | POA: Insufficient documentation

## 2021-06-02 DIAGNOSIS — Z7985 Long-term (current) use of injectable non-insulin antidiabetic drugs: Secondary | ICD-10-CM | POA: Insufficient documentation

## 2021-06-02 DIAGNOSIS — F17210 Nicotine dependence, cigarettes, uncomplicated: Secondary | ICD-10-CM | POA: Insufficient documentation

## 2021-06-02 DIAGNOSIS — Z86010 Personal history of colonic polyps: Secondary | ICD-10-CM

## 2021-06-02 DIAGNOSIS — Z1211 Encounter for screening for malignant neoplasm of colon: Secondary | ICD-10-CM | POA: Insufficient documentation

## 2021-06-02 DIAGNOSIS — Z7951 Long term (current) use of inhaled steroids: Secondary | ICD-10-CM | POA: Insufficient documentation

## 2021-06-02 HISTORY — DX: Type 2 diabetes mellitus without complications: E11.9

## 2021-06-02 HISTORY — PX: COLONOSCOPY WITH PROPOFOL: SHX5780

## 2021-06-02 LAB — GLUCOSE, CAPILLARY: Glucose-Capillary: 129 mg/dL — ABNORMAL HIGH (ref 70–99)

## 2021-06-02 SURGERY — COLONOSCOPY WITH PROPOFOL
Anesthesia: General

## 2021-06-02 MED ORDER — SODIUM CHLORIDE 0.9 % IV SOLN: INTRAVENOUS | Status: DC

## 2021-06-02 MED ORDER — PROPOFOL 500 MG/50ML IV EMUL
INTRAVENOUS | Status: DC | PRN
  Administered 2021-06-02: 150 ug/kg/min via INTRAVENOUS

## 2021-06-02 MED ORDER — PROPOFOL 500 MG/50ML IV EMUL
INTRAVENOUS | Status: AC
  Filled 2021-06-02: qty 50

## 2021-06-02 NOTE — Telephone Encounter (Signed)
Procedure scheduled for 07/04/21. 

## 2021-06-02 NOTE — Progress Notes (Signed)
Patient has to reschedule procedure. Was not properly cleaned out.

## 2021-06-02 NOTE — Anesthesia Postprocedure Evaluation (Signed)
Anesthesia Post Note  Patient: Dominic Ponce  Procedure(s) Performed: COLONOSCOPY WITH PROPOFOL  Patient location during evaluation: PACU Anesthesia Type: General Level of consciousness: awake and oriented Pain management: pain level controlled Vital Signs Assessment: post-procedure vital signs reviewed and stable Respiratory status: spontaneous breathing and respiratory function stable Cardiovascular status: blood pressure returned to baseline Anesthetic complications: no   No notable events documented.   Last Vitals:  Vitals:   06/02/21 1213 06/02/21 1223  BP: 119/72 132/71  Pulse: 76 66  Resp: (!) 24 15  Temp:    SpO2: 100% 100%    Last Pain:  Vitals:   06/02/21 1223  TempSrc:   PainSc: 0-No pain                 VAN STAVEREN,Brandi Armato

## 2021-06-02 NOTE — Anesthesia Procedure Notes (Signed)
Date/Time: 06/02/2021 11:44 AM Performed by: Tonia Ghent Pre-anesthesia Checklist: Patient identified, Emergency Drugs available, Suction available, Patient being monitored and Timeout performed Patient Re-evaluated:Patient Re-evaluated prior to induction Oxygen Delivery Method: Nasal cannula Preoxygenation: Pre-oxygenation with 100% oxygen Induction Type: IV induction Placement Confirmation: positive ETCO2 and CO2 detector

## 2021-06-02 NOTE — Telephone Encounter (Signed)
Pt. Calling to reschedule colonoscopy he needs a different prep the last one did not work for him

## 2021-06-02 NOTE — Op Note (Signed)
[facility] Gastroenterology Patient Name: Dominic Ponce Procedure Date: 06/02/2021 11:36 AM MRN: [id number] Account #: [id number] Date of Birth: 1960-09-17 Admit Type: Outpatient Age: 60 Room: [facility] ENDO ROOM 3 Gender: Male Note Status: Finalized Instrument Name: Prentice Docker [id number] Procedure:             Colonoscopy Indications:           High risk colon cancer surveillance: Personal history                         of colonic polyps, Surveillance: Personal history of                         colonic polyps (unknown histology) on last colonoscopy                         more than 5 years ago Providers:             Wyline Mood MD, MD Referring MD:          Hillery Aldo MD, MD (Referring MD) Medicines:             Monitored Anesthesia Care Complications:         No immediate complications. Procedure:             Pre-Anesthesia Assessment:                        - Prior to the procedure, a History and Physical was                         performed, and patient medications, allergies and                         sensitivities were reviewed. The patient's tolerance                         of previous anesthesia was reviewed.                        - The risks and benefits of the procedure and the                         sedation options and risks were discussed with the                         patient. All questions were answered and informed                         consent was obtained.                        - ASA Grade Assessment: II - A patient with mild                         systemic disease.                        After obtaining informed consent, the colonoscope was  passed under direct vision. Throughout the procedure,                         the patient's blood pressure, pulse, and oxygen                         saturations were monitored continuously. The                         Colonoscope was introduced through the anus and                          advanced to the the cecum, identified by the                         appendiceal orifice. The colonoscopy was performed                         with ease. The patient tolerated the procedure well.                         The quality of the bowel preparation was poor. Findings:      The perianal and digital rectal examinations were normal.      A large amount of semi-liquid stool was found in the entire colon,       interfering with visualization. Impression:            - Preparation of the colon was poor.                        - Stool in the entire examined colon.                        - No specimens collected. Recommendation:        - Discharge patient to home (with escort).                        - Resume previous diet.                        - Continue present medications.                        - Repeat colonoscopy in 2 months because the bowel                         preparation was suboptimal. Procedure Code(s):     --- Professional ---                        [phone], Colonoscopy, flexible; diagnostic, including                         collection of specimen(s) by brushing or washing, when                         performed (separate procedure) Diagnosis Code(s):     --- Professional ---                        Z86.010, Personal history  of colonic polyps CPT copyright 2019 American Medical Association. All rights reserved. The codes documented in this report are preliminary and upon coder review may  be revised to meet current compliance requirements. Wyline Mood, MD Wyline Mood MD, MD 06/02/2021 11:46:25 AM This report has been signed electronically. Number of Addenda: 0 Note Initiated On: 06/02/2021 11:36 AM Scope Withdrawal Time: 0 hours 2 minutes 5 seconds  Total Procedure Duration: 0 hours 3 minutes 20 seconds  Estimated Blood Loss:  Estimated blood loss: none.      [facility]

## 2021-06-02 NOTE — Transfer of Care (Signed)
Immediate Anesthesia Transfer of Care Note  Patient: Dominic Ponce  Procedure(s) Performed: COLONOSCOPY WITH PROPOFOL  Patient Location: PACU  Anesthesia Type:General  Level of Consciousness: awake and sedated  Airway & Oxygen Therapy: Patient Spontanous Breathing and Patient connected to nasal cannula oxygen  Post-op Assessment: Report given to RN and Post -op Vital signs reviewed and stable  Post vital signs: Reviewed and stable  Last Vitals:  Vitals Value Taken Time  BP    Temp    Pulse    Resp    SpO2      Last Pain:  Vitals:   06/02/21 1037  TempSrc: Temporal  PainSc: 0-No pain         Complications: No notable events documented.

## 2021-06-02 NOTE — H&P (Signed)
Wyline Mood, MD [address], [place], [place], [address] [address], [address], [place], [place], [address] Phone: [phone]  Fax: [phone]  Primary Care Physician:  Hillery Aldo, MD   Pre-Procedure History & Physical: HPI:  Dominic Ponce is a 60 y.o. male is here for an colonoscopy.   Past Medical History:  Diagnosis Date   Diabetes mellitus without complication (HCC)    Gout    Hypertension     History reviewed. No pertinent surgical history.  Prior to Admission medications   Medication Sig Start Date End Date Taking? Authorizing Provider  albuterol (VENTOLIN HFA) 108 (90 Base) MCG/ACT inhaler  06/03/18  Yes [provider]  atorvastatin (LIPITOR) 40 MG tablet TAKE 1 BY MOUTH AT BEDTIME FOR HIGH CHOLESTEROL 05/05/18  Yes [provider]  celecoxib (CELEBREX) 100 MG capsule TAKE 1 CAPSULE BY MOUTH TWICE A DAY 06/07/18  Yes [provider]  Dulaglutide (TRULICITY) 0.75 MG/0.5ML SOPN Inject 0.75 mg into the skin once a week.   Yes [provider]  empagliflozin (JARDIANCE) 25 MG TABS tablet Take 25 mg by mouth daily.   Yes [provider]  gabapentin (NEURONTIN) 300 MG capsule Take 300 mg by mouth 2 (two) times daily. 02/08/21  Yes [provider]  lisinopril-hydrochlorothiazide (ZESTORETIC) 20-25 MG tablet Take 1 tablet by mouth once a week.   Yes [provider]  omeprazole (PRILOSEC) 20 MG capsule TAKE 1 CAPSULE BY MOUTH EVERY DAY FOR REFLUX 05/02/18  Yes [provider]  rosuvastatin (CRESTOR) 20 MG tablet Take 20 mg by mouth daily. 02/24/21  Yes [provider]  SYMBICORT 160-4.5 MCG/ACT inhaler Inhale 2 puffs into the lungs 2 (two) times daily. 01/27/21  Yes [provider]  traMADol (ULTRAM) 50 MG tablet Take 1 tablet (50 mg total) by mouth every 6 (six) hours as needed. 12/20/17  Yes Triplett, Cari B, FNP  TRULICITY 1.5 MG/0.5ML SOPN Inject 1.5 mg into the skin once a  week. 02/10/21  Yes [provider]  gabapentin (NEURONTIN) 100 MG capsule TAKE 1 CAPSULE BY MOUTH 3 TIMES A DAY FOR NERVE P AIN Patient not taking: Reported on 06/02/2021 06/03/18   [provider]  lisinopril (PRINIVIL,ZESTRIL) 5 MG tablet Take 1 tablet (5 mg total) by mouth daily. 12/20/17 12/20/18  Triplett, Rulon Eisenmenger B, FNP  lisinopril (PRINIVIL,ZESTRIL) 5 MG tablet TK 1 T PO  QD Patient not taking: Reported on 06/02/2021 12/20/17   [provider]    Allergies as of 03/09/2021   (No Known Allergies)    History reviewed. No pertinent family history.  Social History   Socioeconomic History   Marital status: Single    Spouse name: Not on file   Number of children: Not on file   Years of education: Not on file   Highest education level: Not on file  Occupational History   Not on file  Tobacco Use   Smoking status: Every Day    Packs/day: 0.50    Types: Cigarettes   Smokeless tobacco: Never  Vaping Use   Vaping Use: Never used  Substance and Sexual Activity   Alcohol use: Yes    Comment: occasional   Drug use: Not Currently   Sexual activity: Not on file  Other Topics Concern   Not on file  Social History Narrative   Not on file   Social Determinants of Health   Financial Resource Strain: Not on file  Food Insecurity: Not on file  Transportation Needs:  Not on file  Physical Activity: Not on file  Stress: Not on file  Social Connections: Not on file  Intimate Partner Violence: Not on file    Review of Systems: See HPI, otherwise negative ROS  Physical Exam: BP 124/80   Pulse 88   Temp 97.6 F (36.4 C) (Temporal)   Resp 16   Ht 6\' 1"  (1.854 m)   Wt 101.6 kg   SpO2 100%   BMI 29.55 kg/m  General:   Alert,  pleasant and cooperative in NAD Head:  Normocephalic and atraumatic. Neck:  Supple; no masses or thyromegaly. Lungs:  Clear throughout to auscultation, normal respiratory effort.    Heart:  +S1, +S2, Regular rate and rhythm, No  edema. Abdomen:  Soft, nontender and nondistended. Normal bowel sounds, without guarding, and without rebound.   Neurologic:  Alert and  oriented x4;  grossly normal neurologically.  Impression/Plan: Dominic Ponce is here for an colonoscopy to be performed for surveillance due to prior history of colon polyps   Risks, benefits, limitations, and alternatives regarding  colonoscopy have been reviewed with the patient.  Questions have been answered.  All parties agreeable.   Brooke Dare, MD  06/02/2021, 11:32 AM

## 2021-06-29 ENCOUNTER — Encounter: Payer: Self-pay | Admitting: Gastroenterology

## 2021-07-04 ENCOUNTER — Encounter: Admission: RE | Payer: Self-pay | Source: Home / Self Care

## 2021-07-04 ENCOUNTER — Ambulatory Visit: Admission: RE | Admit: 2021-07-04 | Payer: Medicare Other | Source: Home / Self Care | Admitting: Gastroenterology

## 2021-07-04 ENCOUNTER — Encounter: Payer: Self-pay | Admitting: Registered Nurse

## 2021-07-04 SURGERY — COLONOSCOPY WITH PROPOFOL
Anesthesia: General

## 2021-08-22 ENCOUNTER — Other Ambulatory Visit: Payer: Self-pay | Admitting: *Deleted

## 2021-08-22 DIAGNOSIS — Z87891 Personal history of nicotine dependence: Secondary | ICD-10-CM

## 2021-08-22 DIAGNOSIS — F17210 Nicotine dependence, cigarettes, uncomplicated: Secondary | ICD-10-CM

## 2021-09-05 ENCOUNTER — Encounter: Payer: Self-pay | Admitting: Acute Care

## 2021-09-05 ENCOUNTER — Ambulatory Visit (INDEPENDENT_AMBULATORY_CARE_PROVIDER_SITE_OTHER): Payer: Medicare Other | Admitting: Acute Care

## 2021-09-05 ENCOUNTER — Other Ambulatory Visit: Payer: Self-pay

## 2021-09-05 DIAGNOSIS — F17210 Nicotine dependence, cigarettes, uncomplicated: Secondary | ICD-10-CM

## 2021-09-05 NOTE — Patient Instructions (Signed)
Thank you for participating in the [facility] Screening Program. °It was our pleasure to meet you today. °We will call you with the results of your scan within the next few days. °Your scan will be assigned a Lung RADS category score by the physicians reading the scans.  °This Lung RADS score determines follow up scanning.  °See below for description of categories, and follow up screening recommendations. °We will be in touch to schedule your follow up screening annually or based on recommendations of our providers. °We will fax a copy of your scan results to your Primary Care Physician, or the physician who referred you to the program, to ensure they have the results. °Please call the office if you have any questions or concerns regarding your scanning experience or results.  °Our office number is [phone]. °Please speak with Denise Phelps, RN. She is our Lung Cancer Screening RN. °If she is unavailable when you call, please have the office staff send her a message. She will return your call at her earliest convenience. °Remember, if your scan is normal, we will scan you annually as long as you continue to meet the criteria for the program. (Age 55-77, Current smoker or smoker who has quit within the last 15 years). °If you are a smoker, remember, quitting is the single most powerful action that you can take to decrease your risk of lung cancer and other pulmonary, breathing related problems. °We know quitting is hard, and we are here to help.  °Please let us know if there is anything we can do to help you meet your goal of quitting. °If you are a former smoker, congratulations. We are proud of you! Remain smoke free! °Remember you can refer friends or family members through the number above.  °We will screen them to make sure they meet criteria for the program. °Thank you for helping us take better care of you by participating in Lung Screening. ° °You can receive free nicotine replacement therapy  ( patches, gum or mints) by calling 1-800-QUIT NOW. Please call so we can get you on the path to becoming  a non-smoker. I know it is hard, but you can do this! ° °Lung RADS Categories: ° °Lung RADS 1: no nodules or definitely non-concerning nodules.  °Recommendation is for a repeat annual scan in 12 months. ° °Lung RADS 2:  nodules that are non-concerning in appearance and behavior with a very low likelihood of becoming an active cancer. °Recommendation is for a repeat annual scan in 12 months. ° °Lung RADS 3: nodules that are probably non-concerning , includes nodules with a low likelihood of becoming an active cancer.  Recommendation is for a 6-month repeat screening scan. Often noted after an upper respiratory illness. We will be in touch to make sure you have no questions, and to schedule your 6-month scan. ° °Lung RADS 4 A: nodules with concerning findings, recommendation is most often for a follow up scan in 3 months or additional testing based on our provider's assessment of the scan. We will be in touch to make sure you have no questions and to schedule the recommended 3 month follow up scan. ° °Lung RADS 4 B:  indicates findings that are concerning. We will be in touch with you to schedule additional diagnostic testing based on our provider's  assessment of the scan. ° °Hypnosis for smoking cessation  °[employer] °[phone] ° °Acupuncture for smoking cessation  °[facility] °[phone]  °

## 2021-09-05 NOTE — Progress Notes (Signed)
Virtual Visit via Telephone  Note  I connected with Dominic Ponce on 06/21/21 at  2:00 PM EST by telephone and verified that I am speaking with the correct person using two identifiers.  Location: Patient: Home Provider: Working from home   I discussed the limitations, risks, security and privacy concerns of performing an evaluation and management service by telephone and the availability of in person appointments. I also discussed with the patient that there may be a patient responsible charge related to this service. The patient expressed understanding and agreed to proceed.  Shared Decision Making Visit Lung Cancer Screening Program [phone])   Eligibility: Age 61 y.o. Pack Years Smoking History Calculation 52 (# packs/per year x # years smoked) Recent History of coughing up blood  no Unexplained weight loss? no ( >Than 15 pounds within the last 6 months ) Prior History Lung / other cancer no (Diagnosis within the last 5 years already requiring surveillance chest CT Scans). Smoking Status Current Smoker Former Smokers: Years since quit: NA  Quit Date: NA  Visit Components: Discussion included one or more decision making aids. yes Discussion included risk/benefits of screening. yes Discussion included potential follow up diagnostic testing for abnormal scans. yes Discussion included meaning and risk of over diagnosis. yes Discussion included meaning and risk of False Positives. yes Discussion included meaning of total radiation exposure. yes  Counseling Included: Importance of adherence to annual lung cancer LDCT screening. yes Impact of comorbidities on ability to participate in the program. yes Ability and willingness to under diagnostic treatment. yes  Smoking Cessation Counseling: Current Smokers:  Discussed importance of smoking cessation. yes Information about tobacco cessation classes and interventions provided to patient. yes Patient provided with "ticket" for  LDCT Scan. yes Symptomatic Patient. yes  Counseling(Intermediate counseling: > three minutes) [address] Diagnosis Code: Tobacco Use Z72.0 Asymptomatic Patient NA  Counseling NA Former Smokers:  Discussed the importance of maintaining cigarette abstinence. yes Diagnosis Code: Personal History of Nicotine Dependence. X91.478 Information about tobacco cessation classes and interventions provided to patient. Yes Patient provided with "ticket" for LDCT Scan. yes Written Order for Lung Cancer Screening with LDCT placed in Epic. Yes (CT Chest Lung Cancer Screening Low Dose W/O CM) [id number] Z12.2-Screening of respiratory organs Z87.891-Personal history of nicotine dependence   I spent 25 minutes of face to face time with him discussing the risks and benefits of lung cancer screening. We viewed a power point together that explained in detail the above noted topics. We took the time to pause the power point at intervals to allow for questions to be asked and answered to ensure understanding. We discussed that he had taken the single most powerful action possible to decrease his risk of developing lung cancer when he quit smoking. I counseled him to remain smoke free, and to contact me if he ever had the desire to smoke again so that I can provide resources and tools to help support the effort to remain smoke free. We discussed the time and location of the scan, and that either  Abigail Miyamoto RN or I will call with the results within  24-48 hours of receiving them. He has my card and contact information in the event he needs to speak with me, in addition to a copy of the power point we reviewed as a resource. He verbalized understanding of all of the above and had no further questions upon leaving the office.     I explained to the patient that there has been a  high incidence of coronary artery disease noted on these exams. I explained that this is a non-gated exam therefore degree or severity cannot be  determined. This patient is on statin therapy. I have asked the patient to follow-up with their PCP regarding any incidental finding of coronary artery disease and management with diet or medication as they feel is clinically indicated. The patient verbalized understanding of the above and had no further questions.  I spent 3 minutes counseling on smoking cessation and the health risks of continued tobacco abuse   Dominic Rosas D. Tiburcio Pea, NP-C [facility] Personal contact information can be found on Amion  09/05/2021, 10:11 AM

## 2021-09-07 ENCOUNTER — Ambulatory Visit
Admission: RE | Admit: 2021-09-07 | Discharge: 2021-09-07 | Disposition: A | Discharge status: Home / Self Care | Payer: Medicare Other | Source: Ambulatory Visit | Attending: Family Medicine | Admitting: Family Medicine

## 2021-09-07 DIAGNOSIS — Z87891 Personal history of nicotine dependence: Secondary | ICD-10-CM

## 2021-09-07 DIAGNOSIS — F17210 Nicotine dependence, cigarettes, uncomplicated: Secondary | ICD-10-CM

## 2021-09-22 ENCOUNTER — Other Ambulatory Visit: Payer: Self-pay

## 2021-09-22 ENCOUNTER — Telehealth: Payer: Self-pay | Admitting: Acute Care

## 2021-09-22 DIAGNOSIS — F17210 Nicotine dependence, cigarettes, uncomplicated: Secondary | ICD-10-CM

## 2021-09-22 DIAGNOSIS — Z87891 Personal history of nicotine dependence: Secondary | ICD-10-CM

## 2021-09-22 NOTE — Telephone Encounter (Signed)
Contacted patient to review LDCT results.  No suspicious lung nodules.  Notation of steatosis, atherosclerosis and emphysema.  Patient is on statin medication and also symbicort.  Results faxed to PCP.  Patient acknowledged understanding and had no further questions.

## 2022-04-20 IMAGING — CT CT CHEST LUNG CANCER SCREENING LOW DOSE W/O CM
1 series · 10 of 10 positions shown, 13 images · non-contrast
Comparison: None.

CLINICAL DATA: 61-year-old asymptomatic male current smoker with 52
pack-year smoking history.



[ct lung segmentation data · axial · 0.79mm/px · z∈[-397,-397]mm · 10 of 317 frames shown]
[frame 1/317  mediastinal]
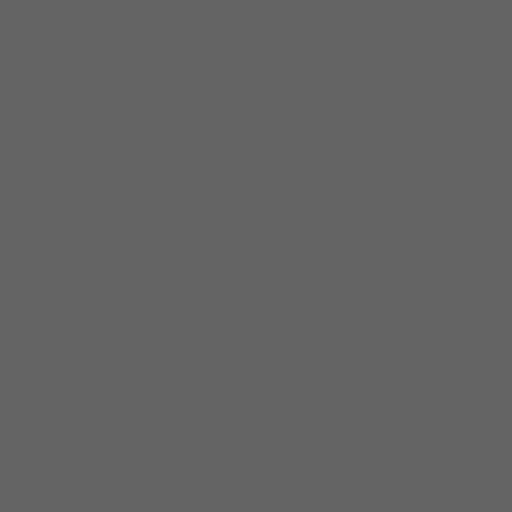
[frame 1/317  lung]
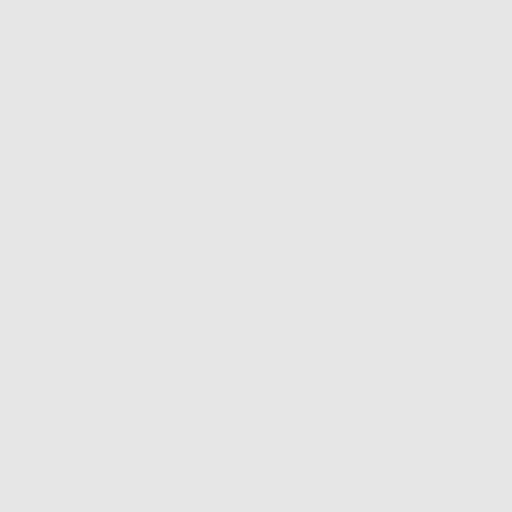
[frame 36/317  lung]
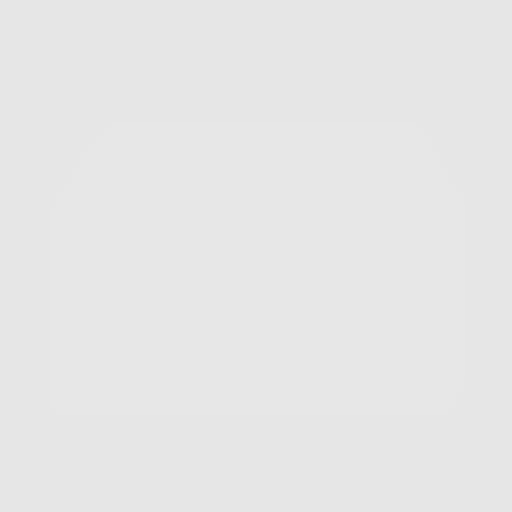
[frame 71/317  lung]
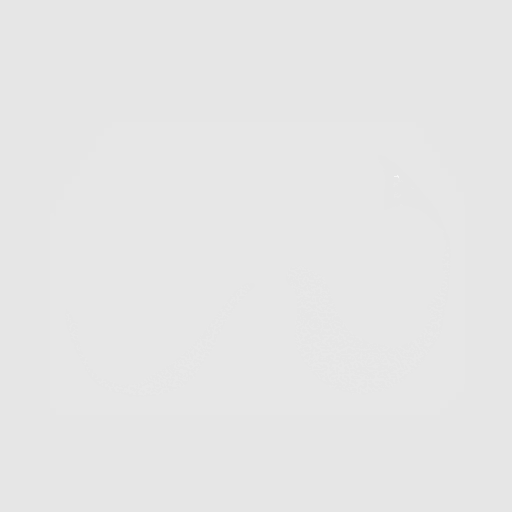
[frame 106/317  lung]
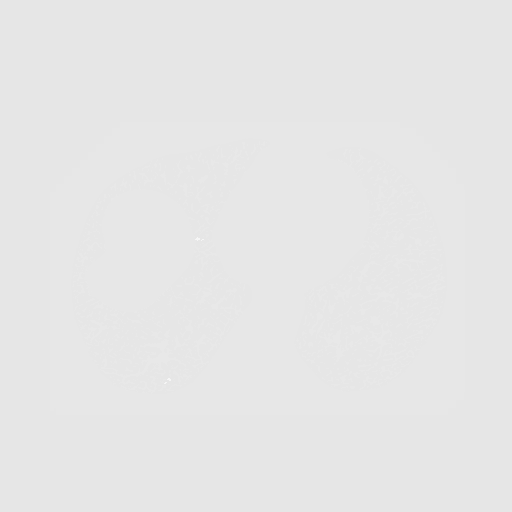
[frame 141/317  mediastinal]
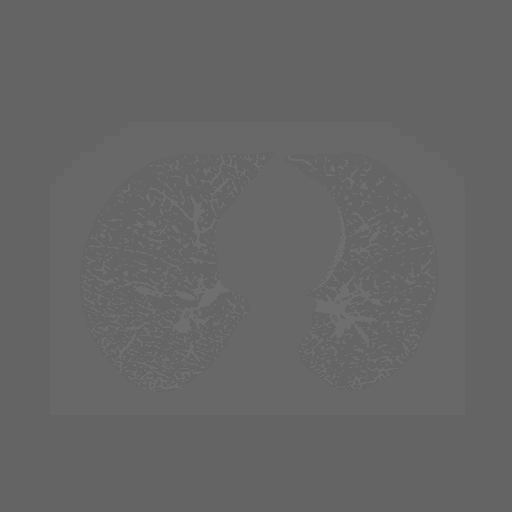
[frame 141/317  lung]
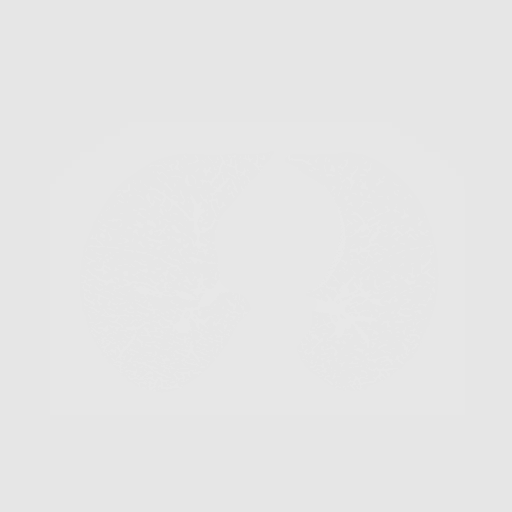
[frame 176/317  lung]
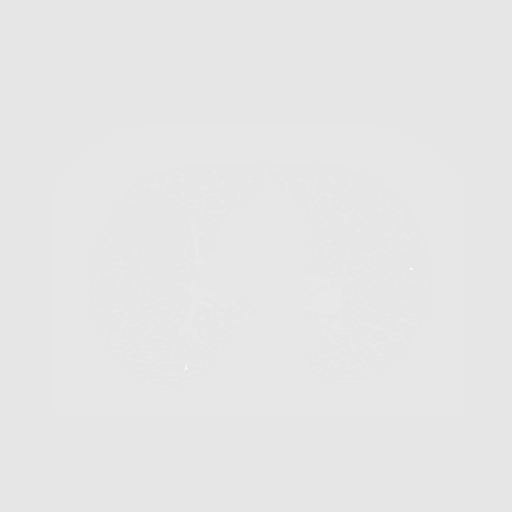
[frame 211/317  lung]
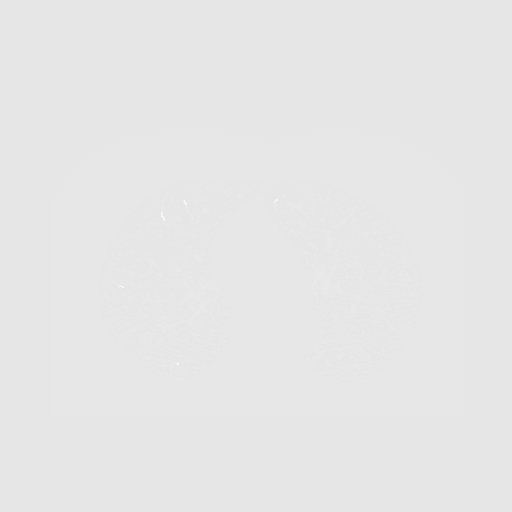
[frame 246/317  lung]
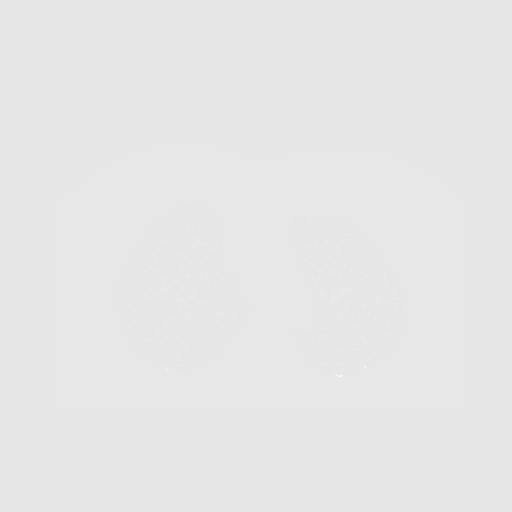
[frame 281/317  mediastinal]
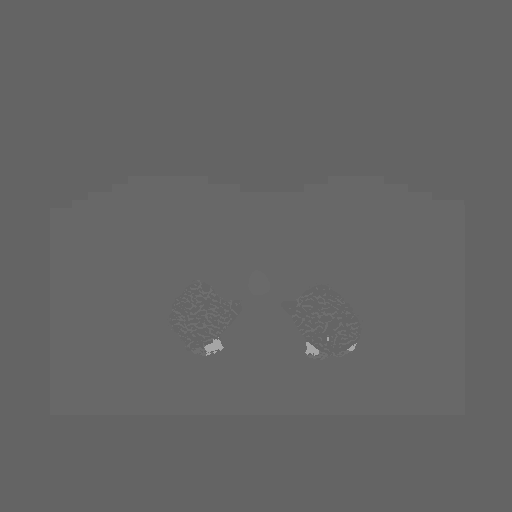
[frame 281/317  lung]
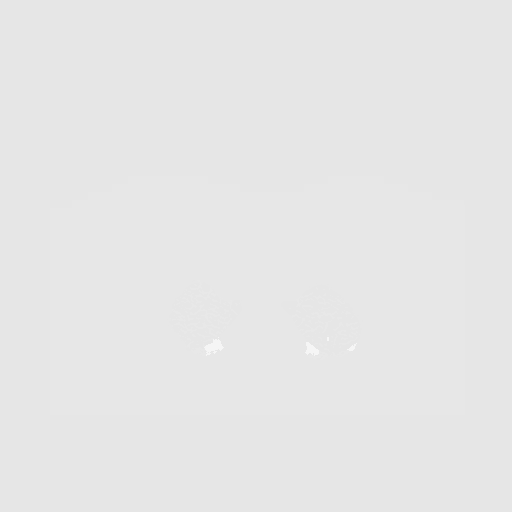
[frame 317/317  lung]
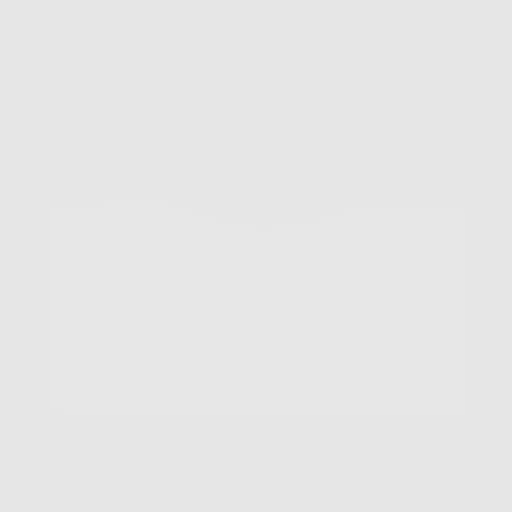

[10 of 10 positions shown; findings below may reference images not displayed]

FINDINGS: Cardiovascular: Normal heart size. No significant pericardial
effusion/thickening. Left anterior descending and left circumflex
coronary atherosclerosis. Atherosclerotic nonaneurysmal thoracic
aorta. Top-normal caliber main pulmonary artery (3.2 cm diameter).

Mediastinum/Nodes: No discrete thyroid nodules. Unremarkable
esophagus. No pathologically enlarged axillary, mediastinal or hilar
lymph nodes, noting limited sensitivity for the detection of hilar
adenopathy on this noncontrast study.

Lungs/Pleura: No pneumothorax. No pleural effusion. Mild paraseptal
and centrilobular emphysema with diffuse bronchial wall thickening.
No acute consolidative airspace disease or lung masses. Solid
subpleural posterior left lower lobe pulmonary nodule measuring
mm in volume derived mean diameter (series 3/image 155). No
additional significant pulmonary nodules.

Upper abdomen: Mild diffuse hepatic steatosis.

Musculoskeletal:  No aggressive appearing focal osseous lesions.
IMPRESSION: 1. Lung-RADS 2, benign appearance or behavior. Continue annual
screening with low-dose chest CT without contrast in 12 months.
2. Two-vessel coronary atherosclerosis.
3. Mild diffuse hepatic steatosis.
4. Aortic Atherosclerosis (UONKB-Q3A.A) and Emphysema (UONKB-D19.J).

## 2022-09-11 ENCOUNTER — Ambulatory Visit
Admission: RE | Admit: 2022-09-11 | Discharge: 2022-09-11 | Disposition: A | Discharge status: Home / Self Care | Payer: 59 | Source: Ambulatory Visit | Attending: Acute Care | Admitting: Acute Care

## 2022-09-11 ENCOUNTER — Other Ambulatory Visit: Payer: Medicare Other

## 2022-09-11 DIAGNOSIS — F17210 Nicotine dependence, cigarettes, uncomplicated: Secondary | ICD-10-CM

## 2022-09-11 DIAGNOSIS — Z87891 Personal history of nicotine dependence: Secondary | ICD-10-CM

## 2022-09-12 ENCOUNTER — Other Ambulatory Visit: Payer: Self-pay

## 2022-09-12 DIAGNOSIS — F17210 Nicotine dependence, cigarettes, uncomplicated: Secondary | ICD-10-CM

## 2022-09-12 DIAGNOSIS — Z87891 Personal history of nicotine dependence: Secondary | ICD-10-CM

## 2023-03-14 ENCOUNTER — Other Ambulatory Visit: Payer: Self-pay | Admitting: Family Medicine

## 2023-03-14 DIAGNOSIS — M545 Low back pain, unspecified: Secondary | ICD-10-CM

## 2023-04-23 ENCOUNTER — Other Ambulatory Visit: Payer: Self-pay | Admitting: Orthopedic Surgery

## 2023-04-23 DIAGNOSIS — M4807 Spinal stenosis, lumbosacral region: Secondary | ICD-10-CM

## 2023-04-25 ENCOUNTER — Ambulatory Visit
Admission: RE | Admit: 2023-04-25 | Discharge: 2023-04-25 | Disposition: A | Discharge status: Home / Self Care | Payer: 59 | Source: Ambulatory Visit | Attending: Orthopedic Surgery | Admitting: Orthopedic Surgery

## 2023-04-25 DIAGNOSIS — M4807 Spinal stenosis, lumbosacral region: Secondary | ICD-10-CM

## 2023-05-07 ENCOUNTER — Telehealth: Payer: Self-pay

## 2023-05-07 ENCOUNTER — Other Ambulatory Visit: Payer: Self-pay | Admitting: *Deleted

## 2023-05-07 ENCOUNTER — Telehealth: Payer: Self-pay | Admitting: *Deleted

## 2023-05-07 DIAGNOSIS — Z860100 Personal history of colon polyps, unspecified: Secondary | ICD-10-CM

## 2023-05-07 MED ORDER — NA SULFATE-K SULFATE-MG SULF 17.5-3.13-1.6 GM/177ML PO SOLN: 1.0000 | Freq: Once | ORAL | 0 refills | Status: AC

## 2023-05-07 NOTE — Telephone Encounter (Signed)
Colonoscopy schedule on 06/06/2023 with Dr Tobi Bastos

## 2023-05-07 NOTE — Telephone Encounter (Signed)
Pt received letter to schedule colonoscopy please return call 

## 2023-05-07 NOTE — Telephone Encounter (Signed)
Gastroenterology Pre-Procedure Review  Request Date: 06/06/2023 Requesting Physician: Dr. Tobi Bastos  PATIENT REVIEW QUESTIONS: The patient responded to the following health history questions as indicated:    1. Are you having any GI issues? no 2. Do you have a personal history of Polyps?  Poor prep on the last colonoscopy  on 06/02/2021, patient was supposed to repeat in 2 months but did not. 3. Do you have a family history of Colon Cancer or Polyps? no 4. Diabetes Mellitus? yes (taking Trulicity and Jardiance) 5. Joint replacements in the past 12 months?no 6. Major health problems in the past 3 months?no 7. Any artificial heart valves, MVP, or defibrillator?no    MEDICATIONS & ALLERGIES:    Patient reports the following regarding taking any anticoagulation/antiplatelet therapy:   Plavix, Coumadin, Eliquis, Xarelto, Lovenox, Pradaxa, Brilinta, or Effient? no Aspirin? no  Patient confirms/reports the following medications:  Current Outpatient Medications  Medication Sig Dispense Refill   Na Sulfate-K Sulfate-Mg Sulf 17.5-3.13-1.6 GM/177ML SOLN Take 1 kit by mouth once for 1 dose. 354 mL 0   albuterol (VENTOLIN HFA) 108 (90 Base) MCG/ACT inhaler      atorvastatin (LIPITOR) 40 MG tablet TAKE 1 BY MOUTH AT BEDTIME FOR HIGH CHOLESTEROL     celecoxib (CELEBREX) 100 MG capsule TAKE 1 CAPSULE BY MOUTH TWICE A DAY     Dulaglutide (TRULICITY) 0.75 MG/0.5ML SOPN Inject 0.75 mg into the skin once a week.     empagliflozin (JARDIANCE) 25 MG TABS tablet Take 25 mg by mouth daily.     gabapentin (NEURONTIN) 100 MG capsule TAKE 1 CAPSULE BY MOUTH 3 TIMES A DAY FOR NERVE P AIN (Patient not taking: Reported on 06/02/2021)     gabapentin (NEURONTIN) 300 MG capsule Take 300 mg by mouth 2 (two) times daily.     lisinopril (PRINIVIL,ZESTRIL) 5 MG tablet Take 1 tablet (5 mg total) by mouth daily. 30 tablet 11   lisinopril (PRINIVIL,ZESTRIL) 5 MG tablet TK 1 T PO  QD (Patient not taking: Reported on 06/02/2021)      lisinopril-hydrochlorothiazide (ZESTORETIC) 20-25 MG tablet Take 1 tablet by mouth once a week.     omeprazole (PRILOSEC) 20 MG capsule TAKE 1 CAPSULE BY MOUTH EVERY DAY FOR REFLUX     rosuvastatin (CRESTOR) 20 MG tablet Take 20 mg by mouth daily.     SYMBICORT 160-4.5 MCG/ACT inhaler Inhale 2 puffs into the lungs 2 (two) times daily.     traMADol (ULTRAM) 50 MG tablet Take 1 tablet (50 mg total) by mouth every 6 (six) hours as needed. 12 tablet 0   TRULICITY 1.5 MG/0.5ML SOPN Inject 1.5 mg into the skin once a week.     No current facility-administered medications for this visit.    Patient confirms/reports the following allergies:  No Known Allergies  Orders Placed This Encounter  Procedures   Ambulatory referral to Gastroenterology    Referral Priority:   Routine    Referral Type:   Consultation    Referral Reason:   Specialty Services Required    Referred to Provider:   Wyline Mood, MD    Number of Visits Requested:   1    AUTHORIZATION INFORMATION Primary Insurance: 1D#: Group #:  Secondary Insurance: 1D#: Group #:  SCHEDULE INFORMATION: Date: 06/06/2023 Time: Location: [facility]

## 2023-06-06 ENCOUNTER — Telehealth: Payer: Self-pay

## 2023-06-06 ENCOUNTER — Ambulatory Visit
Admission: RE | Admit: 2023-06-06 | Discharge: 2023-06-06 | Disposition: A | Discharge status: Home / Self Care | Payer: 59 | Attending: Gastroenterology | Admitting: Gastroenterology

## 2023-06-06 ENCOUNTER — Encounter
Admission: RE | Disposition: A | Discharge status: Home / Self Care | Payer: Self-pay | Source: Home / Self Care | Attending: Gastroenterology

## 2023-06-06 ENCOUNTER — Other Ambulatory Visit: Payer: Self-pay

## 2023-06-06 ENCOUNTER — Encounter: Payer: Self-pay | Admitting: Anesthesiology

## 2023-06-06 DIAGNOSIS — Z860100 Personal history of colon polyps, unspecified: Secondary | ICD-10-CM

## 2023-06-06 DIAGNOSIS — Z539 Procedure and treatment not carried out, unspecified reason: Secondary | ICD-10-CM | POA: Insufficient documentation

## 2023-06-06 HISTORY — PX: COLONOSCOPY WITH PROPOFOL: SHX5780

## 2023-06-06 SURGERY — COLONOSCOPY WITH PROPOFOL
Anesthesia: General

## 2023-06-06 MED ORDER — SODIUM CHLORIDE 0.9 % IV SOLN: INTRAVENOUS | Status: DC

## 2023-06-06 MED ORDER — NA SULFATE-K SULFATE-MG SULF 17.5-3.13-1.6 GM/177ML PO SOLN: 708.0000 mL | Freq: Once | ORAL | 0 refills | Status: AC

## 2023-06-06 NOTE — Progress Notes (Signed)
Patient not clean poor prep

## 2023-06-06 NOTE — Anesthesia Preprocedure Evaluation (Signed)
Anesthesia Evaluation  Patient identified by MRN, date of birth, ID band Patient awake    Reviewed: Allergy & Precautions, NPO status , Patient's Chart, lab work & pertinent test results  Airway Mallampati: II  TM Distance: >3 FB Neck ROM: full    Dental  (+) Upper Dentures, Lower Dentures   Pulmonary COPD,  COPD inhaler    + decreased breath sounds      Cardiovascular Exercise Tolerance: Good hypertension, Pt. on medications Normal cardiovascular exam Rhythm:Regular Rate:Normal     Neuro/Psych  Headaches  negative psych ROS   GI/Hepatic Neg liver ROS,GERD  ,,  Endo/Other  diabetes, Well Controlled, Type 2, Oral Hypoglycemic Agents    Renal/GU negative Renal ROS  negative genitourinary   Musculoskeletal  (+) Arthritis ,  Gout   Abdominal Normal abdominal exam  (+)   Peds  Hematology negative hematology ROS (+)   Anesthesia Other Findings Past Medical History: No date: Diabetes mellitus without complication (HCC) No date: Gout No date: Hypertension  BMI    Body Mass Index: 29.55 kg/m      Reproductive/Obstetrics negative OB ROS                              Anesthesia Physical Anesthesia Plan  ASA: 3  Anesthesia Plan: General   Post-op Pain Management:    Induction: Intravenous  PONV Risk Score and Plan: Treatment may vary due to age or medical condition  Airway Management Planned: Natural Airway and Nasal Cannula  Additional Equipment:   Intra-op Plan:   Post-operative Plan:   Informed Consent:      Dental Advisory Given  Plan Discussed with: CRNA and Surgeon  Anesthesia Plan Comments:          Anesthesia Quick Evaluation

## 2023-06-06 NOTE — Telephone Encounter (Signed)
Dr. Tobi Bastos sent me a secure chat to reach out to the patient and reschedule today's colonoscopy since he wasn't cleaned out. Dr. Tobi Bastos also wanted to make sure that the patient does a two (2) day prep. Therefore, I called patient this afternoon to make sure that he was home but no luck. I left him a detailed message letting him know that I was calling him to reschedule his colonoscopy with a two (2) day prep and instructions, so to please call me back. Waiting for patient to return my call.

## 2023-06-07 ENCOUNTER — Encounter: Payer: Self-pay | Admitting: Gastroenterology

## 2023-06-20 NOTE — Telephone Encounter (Signed)
Called Dominic Ponce again and I left him a detailed message to call me back to reschedule his colonoscopy with a two day prep. However, while I wait for his call, I will also send him a letter to see which way he would respond back.

## 2023-07-23 ENCOUNTER — Telehealth (HOSPITAL_BASED_OUTPATIENT_CLINIC_OR_DEPARTMENT_OTHER): Payer: Self-pay | Admitting: Acute Care

## 2023-07-23 ENCOUNTER — Telehealth: Payer: Self-pay | Admitting: Acute Care

## 2023-07-23 NOTE — Telephone Encounter (Signed)
Left patient a VM that his LDCT is not due until Feb 2025.  He was scheduled with scheduling personnel but they sent a message requesting we change the patient imaging location from [facility] to [place] location.  Left pt a VM that the Dec 2024 appt was canceled due to insurance only pays for LDCT once a year and he is not quite due.  Will schedule closer to Feb due date. Left patient our VM to call if questions.

## 2023-07-26 ENCOUNTER — Ambulatory Visit: Payer: 59

## 2023-08-03 ENCOUNTER — Other Ambulatory Visit: Payer: Self-pay | Admitting: Acute Care

## 2023-08-03 DIAGNOSIS — Z87891 Personal history of nicotine dependence: Secondary | ICD-10-CM

## 2023-08-03 DIAGNOSIS — F17210 Nicotine dependence, cigarettes, uncomplicated: Secondary | ICD-10-CM

## 2023-09-13 ENCOUNTER — Inpatient Hospital Stay: Admission: RE | Admit: 2023-09-13 | Payer: 59 | Source: Ambulatory Visit
